# Patient Record
Sex: Male | Born: 1955 | Race: Black or African American | Hispanic: No | Marital: Married | Smoking: Former smoker
Health system: Southern US, Community
[De-identification: ages and names within clinical notes are randomized; demographics above are authoritative.]

## PROBLEM LIST (undated history)

## (undated) DIAGNOSIS — K118 Other diseases of salivary glands: Secondary | ICD-10-CM

## (undated) DIAGNOSIS — I10 Essential (primary) hypertension: Secondary | ICD-10-CM

## (undated) HISTORY — PX: PAROTID GLAND TUMOR EXCISION: SHX5221

## (undated) HISTORY — DX: Essential (primary) hypertension: I10

---

## 2004-08-01 HISTORY — PX: TONSILLECTOMY: SUR1361

## 2013-07-25 ENCOUNTER — Emergency Department: Payer: Self-pay | Admitting: Emergency Medicine

## 2015-01-22 ENCOUNTER — Other Ambulatory Visit: Payer: Self-pay

## 2015-01-29 ENCOUNTER — Ambulatory Visit: Payer: Self-pay

## 2015-02-03 ENCOUNTER — Ambulatory Visit: Payer: Self-pay

## 2015-02-04 ENCOUNTER — Ambulatory Visit: Payer: Self-pay | Admitting: Internal Medicine

## 2015-03-12 ENCOUNTER — Ambulatory Visit: Payer: Self-pay

## 2015-06-17 ENCOUNTER — Other Ambulatory Visit: Payer: Self-pay

## 2015-06-23 ENCOUNTER — Other Ambulatory Visit: Payer: Self-pay

## 2015-07-15 ENCOUNTER — Ambulatory Visit: Payer: Self-pay | Admitting: Internal Medicine

## 2015-07-15 DIAGNOSIS — I10 Essential (primary) hypertension: Secondary | ICD-10-CM | POA: Insufficient documentation

## 2015-08-05 ENCOUNTER — Ambulatory Visit: Payer: Self-pay

## 2015-08-05 LAB — LIPID PANEL
CHOLESTEROL: 159 mg/dL (ref 0–200)
HDL: 37 mg/dL (ref 35–70)
LDL CALC: 88 mg/dL
TRIGLYCERIDES: 169 mg/dL — AB (ref 40–160)

## 2015-08-05 LAB — BASIC METABOLIC PANEL
BUN: 12 mg/dL (ref 4–21)
Creatinine: 0.9 mg/dL (ref 0.6–1.3)
Glucose: 95 mg/dL
Potassium: 3.8 mmol/L (ref 3.4–5.3)
SODIUM: 143 mmol/L (ref 137–147)

## 2015-08-05 LAB — TSH: TSH: 0.31 u[IU]/mL — AB (ref 0.41–5.90)

## 2015-08-05 LAB — CBC AND DIFFERENTIAL
HEMATOCRIT: 46 % (ref 41–53)
HEMOGLOBIN: 16 g/dL (ref 13.5–17.5)
NEUTROS ABS: 3 /uL
Platelets: 227 10*3/uL (ref 150–399)
WBC: 6.6 10^3/mL

## 2015-08-05 LAB — HEPATIC FUNCTION PANEL
ALT: 19 U/L (ref 10–40)
AST: 29 U/L (ref 14–40)
Alkaline Phosphatase: 71 U/L (ref 25–125)
BILIRUBIN, TOTAL: 0.8 mg/dL

## 2015-09-18 DIAGNOSIS — I10 Essential (primary) hypertension: Secondary | ICD-10-CM

## 2015-10-13 ENCOUNTER — Other Ambulatory Visit: Payer: Self-pay

## 2015-10-14 ENCOUNTER — Telehealth: Payer: Self-pay

## 2015-10-14 ENCOUNTER — Other Ambulatory Visit: Payer: Self-pay

## 2015-10-14 DIAGNOSIS — E7800 Pure hypercholesterolemia, unspecified: Secondary | ICD-10-CM

## 2015-10-14 DIAGNOSIS — I10 Essential (primary) hypertension: Secondary | ICD-10-CM

## 2015-10-14 NOTE — Telephone Encounter (Signed)
Received fax from [facility] to refill lisinopril 20 mg tried to reorder received the following message The order you are attempting to reorder has a previously pended reorder. Do you still want to reorder?      lisinopril (PRINIVIL,ZESTRIL) 40 MG tablet (Order [id number]) was reordered and pended by Lelon MastLorrie Holt     Active Order  Pended Reorder    lisinopril (PRINIVIL,ZESTRIL) 40 MG tablet (Order [id number]) lisinopril (PRINIVIL,ZESTRIL) 40 MG tablet (Order [id number])   Start: (none) Start: 10/13/2015   End: (none) End: (none)   Sig: Take 40 mg by mouth daily. Sig: Take 1 tablet (40 mg total) by mouth daily.   Route: Oral Route: Oral   Class: Historical Med Class:    Provider Info     Ordering User: Darnell Levellivia Murray Ordering User: Lelon MastLorrie Holt   Authorizing Provider: Historical Provider, MD Authorizing Provider:

## 2015-10-15 LAB — CBC WITH DIFFERENTIAL/PLATELET
BASOS: 0 %
Basophils Absolute: 0 10*3/uL (ref 0.0–0.2)
EOS (ABSOLUTE): 0.1 10*3/uL (ref 0.0–0.4)
EOS: 2 %
HEMATOCRIT: 45.3 % (ref 37.5–51.0)
HEMOGLOBIN: 16.1 g/dL (ref 12.6–17.7)
IMMATURE GRANS (ABS): 0 10*3/uL (ref 0.0–0.1)
IMMATURE GRANULOCYTES: 0 %
LYMPHS: 44 %
Lymphocytes Absolute: 3.1 10*3/uL (ref 0.7–3.1)
MCH: 31.2 pg (ref 26.6–33.0)
MCHC: 35.5 g/dL (ref 31.5–35.7)
MCV: 88 fL (ref 79–97)
Monocytes Absolute: 0.7 10*3/uL (ref 0.1–0.9)
Monocytes: 10 %
NEUTROS ABS: 3.1 10*3/uL (ref 1.4–7.0)
NEUTROS PCT: 44 %
Platelets: 243 10*3/uL (ref 150–379)
RBC: 5.16 x10E6/uL (ref 4.14–5.80)
RDW: 14.2 % (ref 12.3–15.4)
WBC: 7.1 10*3/uL (ref 3.4–10.8)

## 2015-10-15 LAB — LIPID PANEL
CHOLESTEROL TOTAL: 172 mg/dL (ref 100–199)
Chol/HDL Ratio: 4.9 ratio units (ref 0.0–5.0)
HDL: 35 mg/dL — ABNORMAL LOW (ref >39)
LDL Calculated: 97 mg/dL (ref 0–99)
TRIGLYCERIDES: 201 mg/dL — AB (ref 0–149)
VLDL Cholesterol Cal: 40 mg/dL (ref 5–40)

## 2015-10-15 LAB — URINALYSIS
BILIRUBIN UA: NEGATIVE
GLUCOSE, UA: NEGATIVE
KETONES UA: NEGATIVE
Leukocytes, UA: NEGATIVE
Nitrite, UA: NEGATIVE
Protein, UA: NEGATIVE
RBC UA: NEGATIVE
Specific Gravity, UA: 1.013 (ref 1.005–1.030)
UUROB: 1 mg/dL (ref 0.2–1.0)
pH, UA: 7.5 (ref 5.0–7.5)

## 2015-10-15 LAB — COMPREHENSIVE METABOLIC PANEL
A/G RATIO: 1.1 — AB (ref 1.2–2.2)
ALBUMIN: 4 g/dL (ref 3.5–5.5)
ALT: 25 IU/L (ref 0–44)
AST: 32 IU/L (ref 0–40)
Alkaline Phosphatase: 69 IU/L (ref 39–117)
BUN/Creatinine Ratio: 10 (ref 9–20)
BUN: 8 mg/dL (ref 6–24)
Bilirubin Total: 0.7 mg/dL (ref 0.0–1.2)
CALCIUM: 9.4 mg/dL (ref 8.7–10.2)
CHLORIDE: 98 mmol/L (ref 96–106)
CO2: 27 mmol/L (ref 18–29)
Creatinine, Ser: 0.83 mg/dL (ref 0.76–1.27)
GFR calc Af Amer: 111 mL/min/{1.73_m2} (ref >59)
GFR calc non Af Amer: 96 mL/min/{1.73_m2} (ref >59)
GLOBULIN, TOTAL: 3.7 g/dL (ref 1.5–4.5)
Glucose: 99 mg/dL (ref 65–99)
Potassium: 3.7 mmol/L (ref 3.5–5.2)
Sodium: 140 mmol/L (ref 134–144)
TOTAL PROTEIN: 7.7 g/dL (ref 6.0–8.5)

## 2015-10-15 LAB — TSH: TSH: 0.571 u[IU]/mL (ref 0.450–4.500)

## 2015-10-20 ENCOUNTER — Telehealth: Payer: Self-pay

## 2015-10-20 NOTE — Telephone Encounter (Signed)
I called patient to reschedule his appointment on 3/22 patient is out of his Lisinopril 20 mg daily and needs a refill. His appointment is now 4/5.

## 2015-10-21 ENCOUNTER — Ambulatory Visit: Payer: Self-pay | Admitting: Internal Medicine

## 2015-10-21 ENCOUNTER — Other Ambulatory Visit: Payer: Self-pay

## 2015-10-21 DIAGNOSIS — I10 Essential (primary) hypertension: Secondary | ICD-10-CM

## 2015-10-21 MED ORDER — LISINOPRIL 40 MG PO TABS: 40.0000 mg | ORAL_TABLET | Freq: Every day | ORAL | Status: DC

## 2015-11-04 ENCOUNTER — Ambulatory Visit: Payer: Self-pay | Admitting: Internal Medicine

## 2015-11-04 ENCOUNTER — Encounter: Payer: Self-pay | Admitting: Internal Medicine

## 2015-11-04 VITALS — BP 145/93 | HR 84 | Wt 230.0 lb

## 2015-11-04 NOTE — Progress Notes (Signed)
   Subjective:    Patient ID: Allen Shah, male    DOB: [birth year], 60 y.o.   MRN: [id number]  HPI  Patient is here today for follow up for hypertension BP has improved but not at target  Patient Active Problem List   Diagnosis Date Noted  . Essential hypertension 07/15/2015   Review of Systems     Objective:   Physical Exam    BP 145/93 mmHg  Pulse 84  Wt 230 lb (104.327 kg)  SpO2 95%    Medication List       This list is accurate as of: 11/04/15  8:55 AM.  Always use your most recent med list.               amLODipine 10 MG tablet  Commonly known as:  NORVASC  Take 20 mg by mouth daily.     hydrochlorothiazide 25 MG tablet  Commonly known as:  HYDRODIURIL  Take 25 mg by mouth daily.     ibuprofen 800 MG tablet  Commonly known as:  ADVIL,MOTRIN  Take 800 mg by mouth as needed.     lisinopril 40 MG tablet  Commonly known as:  PRINIVIL,ZESTRIL  Take 1 tablet (40 mg total) by mouth daily.          Assessment & Plan:  Patient needs to return in 90 days. pt left before seeing provider had a dental appointment in [facility]

## 2015-11-11 ENCOUNTER — Ambulatory Visit: Payer: Self-pay | Admitting: Internal Medicine

## 2015-11-11 ENCOUNTER — Encounter: Payer: Self-pay | Admitting: Internal Medicine

## 2015-11-11 VITALS — BP 148/99 | HR 83 | Temp 98.4°F | Resp 17 | Wt 224.0 lb

## 2015-11-11 DIAGNOSIS — I499 Cardiac arrhythmia, unspecified: Secondary | ICD-10-CM | POA: Insufficient documentation

## 2015-11-11 DIAGNOSIS — I4891 Unspecified atrial fibrillation: Secondary | ICD-10-CM

## 2015-11-11 DIAGNOSIS — I10 Essential (primary) hypertension: Secondary | ICD-10-CM

## 2015-11-11 MED ORDER — IBUPROFEN 800 MG PO TABS: 800.0000 mg | ORAL_TABLET | ORAL | Status: AC | PRN

## 2015-11-11 MED ORDER — HYDROCHLOROTHIAZIDE 25 MG PO TABS: 25.0000 mg | ORAL_TABLET | Freq: Every day | ORAL | Status: DC

## 2015-11-11 MED ORDER — AMLODIPINE BESYLATE 10 MG PO TABS: 20.0000 mg | ORAL_TABLET | Freq: Every day | ORAL | Status: DC

## 2015-11-11 NOTE — Assessment & Plan Note (Addendum)
Pt takes the Lisinpril regularly but forgets to take the Amlodipine regularly. BP taken again while in the room with Dr. Candelaria Stagershaplin - still elevated. Ectopy in heart rhythm, unsure if related to high BP.

## 2015-11-11 NOTE — Progress Notes (Signed)
   Subjective:    Patient ID: Allen Shah, male    DOB: 02/13/1956, 60 y.o.   MRN: [id number]  HPI  Patient Active Problem List   Diagnosis Date Noted  . Cardiac arrhythmia 11/11/2015  . Essential hypertension 07/15/2015    Pt presents with a f/u for hypertension. Pt took Lisinopril today but has forgotten to take Amlodipine. BP elevated today.    Review of Systems  HENT: Positive for sore throat.   Respiratory: Positive for cough.        Objective:   Physical Exam  Constitutional: He is oriented to person, place, and time.  Cardiovascular:  Ectopy of cardiac rhythm.   Pulmonary/Chest: Effort normal and breath sounds normal.  Neurological: He is alert and oriented to person, place, and time.      Medication List       This list is accurate as of: 11/11/15  9:47 AM.  Always use your most recent med list.               amLODipine 10 MG tablet  Commonly known as:  NORVASC  Take 20 mg by mouth daily.     hydrochlorothiazide 25 MG tablet  Commonly known as:  HYDRODIURIL  Take 25 mg by mouth daily.     ibuprofen 800 MG tablet  Commonly known as:  ADVIL,MOTRIN  Take 800 mg by mouth as needed.     lisinopril 40 MG tablet  Commonly known as:  PRINIVIL,ZESTRIL  Take 1 tablet (40 mg total) by mouth daily.       BP 148/99 mmHg  Pulse 83  Temp(Src) 98.4 F (36.9 C)  Resp 17  Wt 224 lb (101.606 kg)  SpO2 98%     Assessment & Plan:  Labs look good.  Pt's cough is most likely due to allergy season.   Essential hypertension Pt takes the Lisinpril regularly but forgets to take the Amlodipine regularly. BP taken again while in the room with Dr. Candelaria Stagershaplin - still elevated. Ectopy in heart rhythm, unsure if related to high BP.   Cardiac arrhythmia Refer for EKG   Md f/u: 2 weeks to get BP checked EKG for evaluation of cardiac arhythmia

## 2015-11-11 NOTE — Assessment & Plan Note (Signed)
Refer for EKG

## 2015-11-25 ENCOUNTER — Encounter: Payer: Self-pay | Admitting: Internal Medicine

## 2015-11-25 ENCOUNTER — Ambulatory Visit: Payer: Self-pay | Admitting: Internal Medicine

## 2015-11-25 VITALS — BP 136/86 | HR 89 | Temp 98.1°F | Wt 225.0 lb

## 2015-11-25 DIAGNOSIS — I10 Essential (primary) hypertension: Secondary | ICD-10-CM

## 2015-11-25 MED ORDER — LISINOPRIL 40 MG PO TABS: 20.0000 mg | ORAL_TABLET | Freq: Every day | ORAL | Status: DC

## 2015-11-25 MED ORDER — AMLODIPINE BESYLATE 10 MG PO TABS: 10.0000 mg | ORAL_TABLET | Freq: Every day | ORAL | Status: DC

## 2015-11-25 NOTE — Progress Notes (Signed)
   Subjective:    Patient ID: Allen Shah, male    DOB: 11/06/1955, 60 y.o.   MRN: [id number]  HPI  Patient Active Problem List   Diagnosis Date Noted  . Cardiac arrhythmia 11/11/2015  . Essential hypertension 07/15/2015   Pt presents with a f/u for hypertension. Pt BP has dropped since last time. He has been taking one Amlodipine pill with Lisinopril daily. Does not need refills.   Pt has not gotten the EKG yet.   Pt takes Lisinopril 20 mg and 1 tablet of Amlodipine.   Review of Systems     Objective:   Physical Exam  Constitutional: He is oriented to person, place, and time.  Cardiovascular: Normal rate, regular rhythm and normal heart sounds.   Pulmonary/Chest: Effort normal and breath sounds normal.  Neurological: He is alert and oriented to person, place, and time.      Medication List       This list is accurate as of: 11/25/15 11:00 AM.  Always use your most recent med list.               amLODipine 10 MG tablet  Commonly known as:  NORVASC  Take 1 tablet (10 mg total) by mouth daily.     hydrochlorothiazide 25 MG tablet  Commonly known as:  HYDRODIURIL  Take 1 tablet (25 mg total) by mouth daily.     ibuprofen 800 MG tablet  Commonly known as:  ADVIL,MOTRIN  Take 1 tablet (800 mg total) by mouth as needed.     lisinopril 40 MG tablet  Commonly known as:  PRINIVIL,ZESTRIL  Take 0.5 tablets (20 mg total) by mouth daily.        BP 136/86 mmHg  Pulse 89  Temp(Src) 98.1 F (36.7 C)  Wt 225 lb (102.059 kg)      Assessment & Plan:  Md f/u in 2 months Labs: Met c, CBC  Essential hypertension Improved from last time.    Pt advised to get EKG at [facility].

## 2015-11-25 NOTE — Assessment & Plan Note (Signed)
Improved from last time

## 2015-12-15 ENCOUNTER — Other Ambulatory Visit: Payer: Self-pay

## 2015-12-15 DIAGNOSIS — I10 Essential (primary) hypertension: Secondary | ICD-10-CM

## 2015-12-17 MED ORDER — LISINOPRIL 40 MG PO TABS: 20.0000 mg | ORAL_TABLET | Freq: Every day | ORAL | Status: DC

## 2016-01-20 ENCOUNTER — Other Ambulatory Visit: Payer: Self-pay

## 2016-01-20 DIAGNOSIS — I10 Essential (primary) hypertension: Secondary | ICD-10-CM

## 2016-01-21 LAB — CBC WITH DIFFERENTIAL/PLATELET
Basophils Absolute: 0 10*3/uL (ref 0.0–0.2)
Basos: 0 %
EOS (ABSOLUTE): 0.1 10*3/uL (ref 0.0–0.4)
Eos: 2 %
Hematocrit: 42.2 % (ref 37.5–51.0)
Hemoglobin: 15 g/dL (ref 12.6–17.7)
Immature Grans (Abs): 0 10*3/uL (ref 0.0–0.1)
Immature Granulocytes: 0 %
Lymphocytes Absolute: 3.4 10*3/uL — ABNORMAL HIGH (ref 0.7–3.1)
Lymphs: 45 %
MCH: 31.3 pg (ref 26.6–33.0)
MCHC: 35.5 g/dL (ref 31.5–35.7)
MCV: 88 fL (ref 79–97)
Monocytes Absolute: 0.8 10*3/uL (ref 0.1–0.9)
Monocytes: 10 %
Neutrophils Absolute: 3.3 10*3/uL (ref 1.4–7.0)
Neutrophils: 43 %
Platelets: 240 10*3/uL (ref 150–379)
RBC: 4.79 x10E6/uL (ref 4.14–5.80)
RDW: 13.7 % (ref 12.3–15.4)
WBC: 7.7 10*3/uL (ref 3.4–10.8)

## 2016-01-21 LAB — COMPREHENSIVE METABOLIC PANEL
ALT: 29 IU/L (ref 0–44)
AST: 33 IU/L (ref 0–40)
Albumin/Globulin Ratio: 1.1 — ABNORMAL LOW (ref 1.2–2.2)
Albumin: 3.9 g/dL (ref 3.5–5.5)
Alkaline Phosphatase: 66 IU/L (ref 39–117)
BUN/Creatinine Ratio: 11 (ref 9–20)
BUN: 9 mg/dL (ref 6–24)
Bilirubin Total: 0.9 mg/dL (ref 0.0–1.2)
CO2: 24 mmol/L (ref 18–29)
Calcium: 9 mg/dL (ref 8.7–10.2)
Chloride: 100 mmol/L (ref 96–106)
Creatinine, Ser: 0.85 mg/dL (ref 0.76–1.27)
GFR calc Af Amer: 110 mL/min/{1.73_m2} (ref >59)
GFR calc non Af Amer: 95 mL/min/{1.73_m2} (ref >59)
Globulin, Total: 3.4 g/dL (ref 1.5–4.5)
Glucose: 87 mg/dL (ref 65–99)
Potassium: 3.6 mmol/L (ref 3.5–5.2)
Sodium: 141 mmol/L (ref 134–144)
Total Protein: 7.3 g/dL (ref 6.0–8.5)

## 2016-01-27 ENCOUNTER — Ambulatory Visit: Payer: Self-pay | Admitting: Internal Medicine

## 2016-01-27 ENCOUNTER — Encounter: Payer: Self-pay | Admitting: Internal Medicine

## 2016-01-27 VITALS — BP 143/86 | HR 78 | Temp 98.1°F

## 2016-01-27 DIAGNOSIS — I10 Essential (primary) hypertension: Secondary | ICD-10-CM

## 2016-01-27 NOTE — Progress Notes (Signed)
   Subjective:    Patient ID: Allen Shah, male    DOB: 04-11-1956, 60 y.o.   MRN: [id number]  HPI Patient presents today with hypertension. Patient also has pain in back.   Patient Active Problem List   Diagnosis Date Noted  . Cardiac arrhythmia 11/11/2015  . Essential hypertension 07/15/2015       Medication List       This list is accurate as of: 01/27/16  9:55 AM.  Always use your most recent med list.               amLODipine 10 MG tablet  Commonly known as:  NORVASC  Take 1 tablet (10 mg total) by mouth daily.     hydrochlorothiazide 25 MG tablet  Commonly known as:  HYDRODIURIL  Take 1 tablet (25 mg total) by mouth daily.     ibuprofen 800 MG tablet  Commonly known as:  ADVIL,MOTRIN  Take 1 tablet (800 mg total) by mouth as needed.     lisinopril 40 MG tablet  Commonly known as:  PRINIVIL,ZESTRIL  Take 0.5 tablets (20 mg total) by mouth daily.        Review of Systems     Objective:   Physical Exam  Constitutional: He is oriented to person, place, and time.  Cardiovascular: Normal rate and regular rhythm.   Pulmonary/Chest: Effort normal and breath sounds normal.  Neurological: He is alert and oriented to person, place, and time.  BP 143/86 mmHg  Pulse 78  Temp(Src) 98.1 F (36.7 C)         Assessment & Plan:  Follow up in 3 months with labs: CBC, MET B, PSA

## 2016-01-27 NOTE — Patient Instructions (Signed)
Follow up in 3 months with lab: MET B, CBC, PSA

## 2016-02-12 ENCOUNTER — Emergency Department: Payer: Self-pay

## 2016-02-12 ENCOUNTER — Emergency Department
Admission: EM | Admit: 2016-02-12 | Discharge: 2016-02-12 | Disposition: A | Discharge status: Home / Self Care | Payer: Self-pay | Attending: Emergency Medicine | Admitting: Emergency Medicine

## 2016-02-12 ENCOUNTER — Encounter: Payer: Self-pay | Admitting: *Deleted

## 2016-02-12 DIAGNOSIS — Z791 Long term (current) use of non-steroidal anti-inflammatories (NSAID): Secondary | ICD-10-CM | POA: Insufficient documentation

## 2016-02-12 DIAGNOSIS — Z87891 Personal history of nicotine dependence: Secondary | ICD-10-CM | POA: Insufficient documentation

## 2016-02-12 DIAGNOSIS — R0789 Other chest pain: Secondary | ICD-10-CM

## 2016-02-12 DIAGNOSIS — Z79899 Other long term (current) drug therapy: Secondary | ICD-10-CM | POA: Insufficient documentation

## 2016-02-12 DIAGNOSIS — I10 Essential (primary) hypertension: Secondary | ICD-10-CM | POA: Insufficient documentation

## 2016-02-12 DIAGNOSIS — E860 Dehydration: Secondary | ICD-10-CM | POA: Insufficient documentation

## 2016-02-12 HISTORY — DX: Other diseases of salivary glands: K11.8

## 2016-02-12 LAB — BASIC METABOLIC PANEL
Anion gap: 8 (ref 5–15)
BUN: 14 mg/dL (ref 6–20)
CALCIUM: 9.6 mg/dL (ref 8.9–10.3)
CO2: 24 mmol/L (ref 22–32)
CREATININE: 1.05 mg/dL (ref 0.61–1.24)
Chloride: 108 mmol/L (ref 101–111)
GFR calc Af Amer: >60 mL/min (ref >60)
Glucose, Bld: 109 mg/dL — ABNORMAL HIGH (ref 65–99)
POTASSIUM: 3.2 mmol/L — AB (ref 3.5–5.1)
SODIUM: 140 mmol/L (ref 135–145)

## 2016-02-12 LAB — CBC
HEMATOCRIT: 44.1 % (ref 40.0–52.0)
Hemoglobin: 15.6 g/dL (ref 13.0–18.0)
MCH: 31.4 pg (ref 26.0–34.0)
MCHC: 35.4 g/dL (ref 32.0–36.0)
MCV: 88.7 fL (ref 80.0–100.0)
PLATELETS: 254 10*3/uL (ref 150–440)
RBC: 4.97 MIL/uL (ref 4.40–5.90)
RDW: 13.3 % (ref 11.5–14.5)
WBC: 13.7 10*3/uL — AB (ref 3.8–10.6)

## 2016-02-12 LAB — TROPONIN I

## 2016-02-12 MED ORDER — ASPIRIN 81 MG PO CHEW
CHEWABLE_TABLET | ORAL | Status: AC
  Filled 2016-02-12: qty 4

## 2016-02-12 MED ORDER — ASPIRIN 81 MG PO CHEW
324.0000 mg | CHEWABLE_TABLET | Freq: Once | ORAL | Status: AC
  Administered 2016-02-12: 324 mg via ORAL

## 2016-02-12 MED ORDER — MORPHINE SULFATE (PF) 4 MG/ML IV SOLN
4.0000 mg | Freq: Once | INTRAVENOUS | Status: AC
  Administered 2016-02-12: 4 mg via INTRAVENOUS

## 2016-02-12 MED ORDER — OXYCODONE-ACETAMINOPHEN 5-325 MG PO TABS: 1.0000 | ORAL_TABLET | Freq: Four times a day (QID) | ORAL | Status: AC | PRN

## 2016-02-12 MED ORDER — ONDANSETRON HCL 4 MG/2ML IJ SOLN
4.0000 mg | Freq: Once | INTRAMUSCULAR | Status: AC
  Administered 2016-02-12: 4 mg via INTRAVENOUS

## 2016-02-12 MED ORDER — OXYCODONE-ACETAMINOPHEN 5-325 MG PO TABS
1.0000 | ORAL_TABLET | Freq: Once | ORAL | Status: AC
  Administered 2016-02-12: 1 via ORAL
  Filled 2016-02-12: qty 1

## 2016-02-12 MED ORDER — MORPHINE SULFATE (PF) 4 MG/ML IV SOLN
INTRAVENOUS | Status: AC
  Filled 2016-02-12: qty 1

## 2016-02-12 MED ORDER — ONDANSETRON HCL 4 MG/2ML IJ SOLN
INTRAMUSCULAR | Status: AC
  Filled 2016-02-12: qty 2

## 2016-02-12 MED ORDER — KETOROLAC TROMETHAMINE 30 MG/ML IJ SOLN
15.0000 mg | INTRAMUSCULAR | Status: AC
  Administered 2016-02-12: 15 mg via INTRAVENOUS
  Filled 2016-02-12: qty 1

## 2016-02-12 MED ORDER — SODIUM CHLORIDE 0.9 % IV BOLUS (SEPSIS)
1000.0000 mL | Freq: Once | INTRAVENOUS | Status: AC
  Administered 2016-02-12: 1000 mL via INTRAVENOUS

## 2016-02-12 NOTE — Discharge Instructions (Signed)
You were prescribed a medication that is potentially sedating. Do not drink alcohol, drive or participate in any other potentially dangerous activities while taking this medication as it may make you sleepy. Do not take this medication with any other sedating medications, either prescription or over-the-counter. If you were prescribed Percocet or Vicodin, do not take these with acetaminophen (Tylenol) as it is already contained within these medications.   Opioid pain medications (or "narcotics") can be habit forming.  Use it as little as possible to achieve adequate pain control.  Do not use or use it with extreme caution if you have a history of opiate abuse or dependence.  If you are on a pain contract with your primary care doctor or a pain specialist, be sure to let them know you were prescribed this medication today from the [facility].  This medication is intended for your use only - do not give any to anyone else and keep it in a secure place where nobody else, especially children and pets, have access to it.  It will also cause or worsen constipation, so you may want to consider taking an over-the-counter stool softener while you are taking this medication.  Chest Wall Pain Chest wall pain is pain in or around the bones and muscles of your chest. Sometimes, an injury causes this pain. Sometimes, the cause may not be known. This pain may take several weeks or longer to get better. HOME CARE INSTRUCTIONS  Pay attention to any changes in your symptoms. Take these actions to help with your pain:   Rest as told by your health care provider.   Avoid activities that cause pain. These include any activities that use your chest muscles or your abdominal and side muscles to lift heavy items.   If directed, apply ice to the painful area:  Put ice in a plastic bag.  Place a towel between your skin and the bag.  Leave the ice on for 20 minutes, 2-3 times per day.  Take  over-the-counter and prescription medicines only as told by your health care provider.  Do not use tobacco products, including cigarettes, chewing tobacco, and e-cigarettes. If you need help quitting, ask your health care provider.  Keep all follow-up visits as told by your health care provider. This is important. SEEK MEDICAL CARE IF:  You have a fever.  Your chest pain becomes worse.  You have new symptoms. SEEK IMMEDIATE MEDICAL CARE IF:  You have nausea or vomiting.  You feel sweaty or light-headed.  You have a cough with phlegm (sputum) or you cough up blood.  You develop shortness of breath.   This information is not intended to replace advice given to you by your health care provider. Make sure you discuss any questions you have with your health care provider.   Document Released: 07/18/2005 Document Revised: 04/08/2015 Document Reviewed: 10/13/2014 Elsevier Interactive Patient Education 2016 Elsevier Inc.  Dehydration, Adult Dehydration is a condition in which you do not have enough fluid or water in your body. It happens when you take in less fluid than you lose. Vital organs such as the kidneys, brain, and heart cannot function without a proper amount of fluids. Any loss of fluids from the body can cause dehydration.  Dehydration can range from mild to severe. This condition should be treated right away to help prevent it from becoming severe. CAUSES  This condition may be caused by:  Vomiting.  Diarrhea.  Excessive sweating, such as when exercising  in hot or humid weather.  Not drinking enough fluid during strenuous exercise or during an illness.  Excessive urine output.  Fever.  Certain medicines. RISK FACTORS This condition is more likely to develop in:  People who are taking certain medicines that cause the body to lose excess fluid (diuretics).   People who have a chronic illness, such as diabetes, that may increase urination.  Older adults.    People who live at high altitudes.   People who participate in endurance sports.  SYMPTOMS  Mild Dehydration  Thirst.  Dry lips.  Slightly dry mouth.  Dry, warm skin. Moderate Dehydration  Very dry mouth.   Muscle cramps.   Dark urine and decreased urine production.   Decreased tear production.   Headache.   Light-headedness, especially when you stand up from a sitting position.  Severe Dehydration  Changes in skin.   Cold and clammy skin.   Skin does not spring back quickly when lightly pinched and released.   Changes in body fluids.   Extreme thirst.   No tears.   Not able to sweat when body temperature is high, such as in hot weather.   Minimal urine production.   Changes in vital signs.   Rapid, weak pulse (more than 100 beats per minute when you are sitting still).   Rapid breathing.   Low blood pressure.   Other changes.   Sunken eyes.   Cold hands and feet.   Confusion.  Lethargy and difficulty being awakened.  Fainting (syncope).   Short-term weight loss.   Unconsciousness. DIAGNOSIS  This condition may be diagnosed based on your symptoms. You may also have tests to determine how severe your dehydration is. These tests may include:   Urine tests.   Blood tests.  TREATMENT  Treatment for this condition depends on the severity. Mild or moderate dehydration can often be treated at home. Treatment should be started right away. Do not wait until dehydration becomes severe. Severe dehydration needs to be treated at the hospital. Treatment for Mild Dehydration  Drinking plenty of water to replace the fluid you have lost.   Replacing minerals in your blood (electrolytes) that you may have lost.  Treatment for Moderate Dehydration  Consuming oral rehydration solution (ORS). Treatment for Severe Dehydration  Receiving fluid through an IV tube.   Receiving electrolyte solution through a feeding  tube that is passed through your nose and into your stomach (nasogastric tube or NG tube).  Correcting any abnormalities in electrolytes. HOME CARE INSTRUCTIONS   Drink enough fluid to keep your urine clear or pale yellow.   Drink water or fluid slowly by taking small sips. You can also try sucking on ice cubes.  Have food or beverages that contain electrolytes. Examples include bananas and sports drinks.  Take over-the-counter and prescription medicines only as told by your health care provider.   Prepare ORS according to the manufacturer's instructions. Take sips of ORS every 5 minutes until your urine returns to normal.  If you have vomiting or diarrhea, continue to try to drink water, ORS, or both.   If you have diarrhea, avoid:   Beverages that contain caffeine.   Fruit juice.   Milk.   Carbonated soft drinks.  Do not take salt tablets. This can lead to the condition of having too much sodium in your body (hypernatremia).  SEEK MEDICAL CARE IF:  You cannot eat or drink without vomiting.  You have had moderate diarrhea during a period of more  than 24 hours.  You have a fever. SEEK IMMEDIATE MEDICAL CARE IF:   You have extreme thirst.  You have severe diarrhea.  You have not urinated in 6-8 hours, or you have urinated only a small amount of very dark urine.  You have shriveled skin.  You are dizzy, confused, or both.   This information is not intended to replace advice given to you by your health care provider. Make sure you discuss any questions you have with your health care provider.   Document Released: 07/18/2005 Document Revised: 04/08/2015 Document Reviewed: 12/03/2014 Elsevier Interactive Patient Education [employer]2016 Elsevier Inc.

## 2016-02-12 NOTE — ED Provider Notes (Signed)
[facility] Emergency Department Provider Note  ____________________________________________  Time seen: 10:25 PM  I have reviewed the triage vital signs and the nursing notes.   HISTORY  Chief Complaint Chest Pain    HPI Allen Shah is a 60 y.o. male who complains of sudden onset of right rib and chest pain while working in the yard today, around 6 PM today. This worse with movement. Not exertional. He had been outside mowing the grass and edging the driveway and using a shovel to clean up some edges of the yard. This is not normal activity for him. Developed this pain in the right chest also became dizzy. No vomiting. No shortness of breath. Had not had any water to drink for many hours when this happened.     Past Medical History  Diagnosis Date  . Hypertension   . Parotid mass      Patient Active Problem List   Diagnosis Date Noted  . Cardiac arrhythmia 11/11/2015  . Essential hypertension 07/15/2015     Past Surgical History  Procedure Laterality Date  . Tonsillectomy  2006  . Parotid gland tumor excision Left      Current Outpatient Rx  Name  Route  Sig  Dispense  Refill  . amLODipine (NORVASC) 10 MG tablet   Oral   Take 1 tablet (10 mg total) by mouth daily.   90 tablet   2   . hydrochlorothiazide (HYDRODIURIL) 25 MG tablet   Oral   Take 1 tablet (25 mg total) by mouth daily.   90 tablet   2   . ibuprofen (ADVIL,MOTRIN) 800 MG tablet   Oral   Take 1 tablet (800 mg total) by mouth as needed.   30 tablet   2   . lisinopril (PRINIVIL,ZESTRIL) 40 MG tablet   Oral   Take 0.5 tablets (20 mg total) by mouth daily.   90 tablet   2   . oxyCODONE-acetaminophen (ROXICET) 5-325 MG tablet   Oral   Take 1 tablet by mouth every 6 (six) hours as needed for severe pain.   6 tablet   0      Allergies Review of patient's allergies indicates no known allergies.   Family History  Problem Relation Age of Onset  . Cancer  Mother   . Diabetes type II Father   . Kidney disease Father   . Hypertension Father     Social History Social History  Substance Use Topics  . Smoking status: Former Smoker    Quit date: 09/17/2005  . Smokeless tobacco: Never Used  . Alcohol Use: No    Review of Systems  Constitutional:   No fever or chills.  ENT:   No sore throat. No rhinorrhea. Cardiovascular:   Positive as above chest pain. Respiratory:   No dyspnea or cough. Gastrointestinal:   Negative for abdominal pain, vomiting and diarrhea.  Genitourinary:   Negative for dysuria or difficulty urinating. Musculoskeletal:   Negative for focal pain or swelling Neurological:   Negative for headaches. Positive dizziness 10-point ROS otherwise negative.  ____________________________________________   PHYSICAL EXAM:  VITAL SIGNS: ED Triage Vitals  Enc Vitals Group     BP 02/12/16 2055 120/94 mmHg     Pulse Rate 02/12/16 2055 83     Resp 02/12/16 2055 20     Temp 02/12/16 2055 98 F (36.7 C)     Temp Source 02/12/16 2055 Oral     SpO2 02/12/16 2055 97 %  Weight 02/12/16 2055 225 lb (102.059 kg)     Height 02/12/16 2055 6' (1.829 m)     Head Cir --      Peak Flow --      Pain Score 02/12/16 2056 10     Pain Loc --      Pain Edu? --      Excl. in GC? --     Vital signs reviewed, nursing assessments reviewed.   Constitutional:   Alert and oriented. Well appearing and in no distress. Eyes:   No scleral icterus. No conjunctival pallor. PERRL. EOMI.  No nystagmus. ENT   Head:   Normocephalic and atraumatic.   Nose:   No congestion/rhinnorhea. No septal hematoma   Mouth/Throat:   MMM, no pharyngeal erythema. No peritonsillar mass.    Neck:   No stridor. No SubQ emphysema. No meningismus. Hematological/Lymphatic/Immunilogical:   No cervical lymphadenopathy. Cardiovascular:   RRR. Symmetric bilateral radial and DP pulses.  No murmurs.  Respiratory:   Normal respiratory effort without tachypnea  nor retractions. Breath sounds are clear and equal bilaterally. No wheezes/rales/rhonchi. Gastrointestinal:   Soft and nontender. Non distended. There is no CVA tenderness.  No rebound, rigidity, or guarding. Genitourinary:   deferred Musculoskeletal:   Nontender with normal range of motion in all extremities. No joint effusions.  No lower extremity tenderness.  No edema.Right inferior posterior chest wall tender to the touch, reproduces his pain Neurologic:   Normal speech and language.  CN 2-10 normal. Motor grossly intact. No gross focal neurologic deficits are appreciated.  Skin:    Skin is warm, dry and intact. No rash noted.  No petechiae, purpura, or bullae.  ____________________________________________    LABS (pertinent positives/negatives) (all labs ordered are listed, but only abnormal results are displayed) Labs Reviewed  BASIC METABOLIC PANEL - Abnormal; Notable for the following:    Potassium 3.2 (*)    Glucose, Bld 109 (*)    All other components within normal limits  CBC - Abnormal; Notable for the following:    WBC 13.7 (*)    All other components within normal limits  TROPONIN I   ____________________________________________   EKG  Interpreted by me Normal sinus rhythm rate of 81, normal axis and intervals. Normal QRS ST segments and T waves  ____________________________________________    RADIOLOGY  Chest x-ray unremarkable  ____________________________________________   PROCEDURES   ____________________________________________   INITIAL IMPRESSION / ASSESSMENT AND PLAN / ED COURSE  Pertinent labs & imaging results that were available during my care of the patient were reviewed by me and considered in my medical decision making (see chart for details).  Patient well appearing no acute distress. EKG unremarkable, labs unremarkable. Vital signs normal. Muscular skeletal chest wall pain with dehydration.Considering the patient's symptoms, medical  history, and physical examination today, I have low suspicion for ACS, PE, TAD, pneumothorax, carditis, mediastinitis, pneumonia, CHF, or sepsis.  Feels better with analgesia and IV fluids. I'll give oral Percocet to maintain symptom control through the night, very short prescription for Percocet. Toradol. Follow-up with primary care.     ____________________________________________   FINAL CLINICAL IMPRESSION(S) / ED DIAGNOSES  Final diagnoses:  Dehydration  Chest wall pain       Portions of this note were generated with dragon dictation software. Dictation errors may occur despite best attempts at proofreading.   Sharman CheekPhillip Keo Schirmer, MD 02/12/16 2257

## 2016-02-12 NOTE — ED Notes (Signed)
Pt presents w/ c/o sudden onset of R rib and chest pain while working in the yard today. Pt reports pain unresolved w/ position changes and OTC ibuprofen taken prior to arrival. Pt reports pain worse w/ movement.

## 2016-03-24 ENCOUNTER — Ambulatory Visit: Payer: Self-pay | Admitting: Ophthalmology

## 2016-04-13 ENCOUNTER — Ambulatory Visit: Payer: Self-pay | Admitting: Ophthalmology

## 2016-04-27 ENCOUNTER — Other Ambulatory Visit: Payer: Self-pay

## 2016-04-27 DIAGNOSIS — I10 Essential (primary) hypertension: Secondary | ICD-10-CM

## 2016-04-28 LAB — BASIC METABOLIC PANEL
BUN/Creatinine Ratio: 13 (ref 10–24)
BUN: 12 mg/dL (ref 8–27)
CALCIUM: 9.4 mg/dL (ref 8.6–10.2)
CO2: 25 mmol/L (ref 18–29)
CREATININE: 0.93 mg/dL (ref 0.76–1.27)
Chloride: 102 mmol/L (ref 96–106)
GFR calc Af Amer: 103 mL/min/{1.73_m2} (ref >59)
GFR, EST NON AFRICAN AMERICAN: 89 mL/min/{1.73_m2} (ref >59)
Glucose: 95 mg/dL (ref 65–99)
POTASSIUM: 3.3 mmol/L — AB (ref 3.5–5.2)
Sodium: 143 mmol/L (ref 134–144)

## 2016-04-28 LAB — CBC WITH DIFFERENTIAL/PLATELET
BASOS ABS: 0 10*3/uL (ref 0.0–0.2)
BASOS: 0 %
EOS (ABSOLUTE): 0.1 10*3/uL (ref 0.0–0.4)
Eos: 1 %
HEMATOCRIT: 41 % (ref 37.5–51.0)
HEMOGLOBIN: 14.8 g/dL (ref 12.6–17.7)
IMMATURE GRANULOCYTES: 0 %
Immature Grans (Abs): 0 10*3/uL (ref 0.0–0.1)
LYMPHS: 38 %
Lymphocytes Absolute: 2.7 10*3/uL (ref 0.7–3.1)
MCH: 31 pg (ref 26.6–33.0)
MCHC: 36.1 g/dL — ABNORMAL HIGH (ref 31.5–35.7)
MCV: 86 fL (ref 79–97)
MONOS ABS: 0.8 10*3/uL (ref 0.1–0.9)
Monocytes: 12 %
NEUTROS PCT: 49 %
Neutrophils Absolute: 3.4 10*3/uL (ref 1.4–7.0)
Platelets: 273 10*3/uL (ref 150–379)
RBC: 4.78 x10E6/uL (ref 4.14–5.80)
RDW: 14.1 % (ref 12.3–15.4)
WBC: 7.1 10*3/uL (ref 3.4–10.8)

## 2016-04-28 LAB — PSA: PROSTATE SPECIFIC AG, SERUM: 1.8 ng/mL (ref 0.0–4.0)

## 2016-05-04 ENCOUNTER — Ambulatory Visit: Payer: Self-pay | Admitting: Internal Medicine

## 2016-05-04 ENCOUNTER — Encounter: Payer: Self-pay | Admitting: Internal Medicine

## 2016-05-04 DIAGNOSIS — I10 Essential (primary) hypertension: Secondary | ICD-10-CM

## 2016-05-04 MED ORDER — TRIAMTERENE-HCTZ 75-50 MG PO TABS: 1.0000 | ORAL_TABLET | Freq: Every day | ORAL | 2 refills | Status: AC

## 2016-05-04 MED ORDER — LISINOPRIL 40 MG PO TABS: 20.0000 mg | ORAL_TABLET | Freq: Every day | ORAL | 2 refills | Status: AC

## 2016-05-04 MED ORDER — AMLODIPINE BESYLATE 10 MG PO TABS: 10.0000 mg | ORAL_TABLET | Freq: Every day | ORAL | 2 refills | Status: AC

## 2016-05-04 NOTE — Progress Notes (Signed)
   Subjective:    Patient ID: Allen Shah, male    DOB: 10/26/1955, 60 y.o.   MRN: [id number]  HPI   Pt f/u for lab results. Results look normal w/ elevated Potassium levels Pt reports visit to ED in July for dehydration  BP elevated at 146/94  Patient Active Problem List   Diagnosis Date Noted  . Cardiac arrhythmia 11/11/2015  . Essential hypertension 07/15/2015     Medication List       Accurate as of 05/04/16 10:43 AM. Always use your most recent med list.          amLODipine 10 MG tablet Commonly known as:  NORVASC Take 1 tablet (10 mg total) by mouth daily.   hydrochlorothiazide 25 MG tablet Commonly known as:  HYDRODIURIL Take 1 tablet (25 mg total) by mouth daily.   ibuprofen 800 MG tablet Commonly known as:  ADVIL,MOTRIN Take 1 tablet (800 mg total) by mouth as needed.   lisinopril 40 MG tablet Commonly known as:  PRINIVIL,ZESTRIL Take 0.5 tablets (20 mg total) by mouth daily.   oxyCODONE-acetaminophen 5-325 MG tablet Commonly known as:  ROXICET Take 1 tablet by mouth every 6 (six) hours as needed for severe pain.         Review of Systems  BP retaken at 146/94    Objective:   Physical Exam  Constitutional: He is oriented to person, place, and time.  Cardiovascular: Normal rate, regular rhythm and normal heart sounds.   Pulmonary/Chest: Effort normal and breath sounds normal.  Neurological: He is alert and oriented to person, place, and time.     BP (!) 150/100   Pulse 73   Temp 98.2 F (36.8 C) (Oral)   Wt 224 lb (101.6 kg)   BMI 30.38 kg/m        Assessment & Plan:   F/u in 3 mnths w/ labs: Met C, CBC

## 2016-05-04 NOTE — Patient Instructions (Signed)
F/u in 3 mnths w/ labs: Met C, CBC

## 2016-08-03 ENCOUNTER — Ambulatory Visit: Payer: Self-pay | Admitting: Internal Medicine

## 2016-12-26 ENCOUNTER — Encounter: Payer: Self-pay | Admitting: Emergency Medicine

## 2016-12-26 ENCOUNTER — Emergency Department: Payer: BLUE CROSS/BLUE SHIELD

## 2016-12-26 ENCOUNTER — Emergency Department
Admission: EM | Admit: 2016-12-26 | Discharge: 2016-12-26 | Disposition: A | Discharge status: Home / Self Care | Payer: BLUE CROSS/BLUE SHIELD | Attending: Emergency Medicine | Admitting: Emergency Medicine

## 2016-12-26 DIAGNOSIS — Z87891 Personal history of nicotine dependence: Secondary | ICD-10-CM | POA: Diagnosis not present

## 2016-12-26 DIAGNOSIS — R1011 Right upper quadrant pain: Secondary | ICD-10-CM | POA: Diagnosis not present

## 2016-12-26 DIAGNOSIS — I10 Essential (primary) hypertension: Secondary | ICD-10-CM | POA: Insufficient documentation

## 2016-12-26 DIAGNOSIS — J984 Other disorders of lung: Secondary | ICD-10-CM | POA: Diagnosis not present

## 2016-12-26 DIAGNOSIS — M549 Dorsalgia, unspecified: Secondary | ICD-10-CM

## 2016-12-26 DIAGNOSIS — R109 Unspecified abdominal pain: Secondary | ICD-10-CM

## 2016-12-26 DIAGNOSIS — R52 Pain, unspecified: Secondary | ICD-10-CM

## 2016-12-26 DIAGNOSIS — Z79899 Other long term (current) drug therapy: Secondary | ICD-10-CM | POA: Insufficient documentation

## 2016-12-26 LAB — DIFFERENTIAL
BASOS ABS: 0.1 10*3/uL (ref 0–0.1)
BASOS PCT: 1 %
EOS ABS: 0.2 10*3/uL (ref 0–0.7)
EOS PCT: 2 %
Lymphocytes Relative: 42 %
Lymphs Abs: 5.4 10*3/uL — ABNORMAL HIGH (ref 1.0–3.6)
Monocytes Absolute: 1.3 10*3/uL — ABNORMAL HIGH (ref 0.2–1.0)
Monocytes Relative: 10 %
Neutro Abs: 5.7 10*3/uL (ref 1.4–6.5)
Neutrophils Relative %: 45 %

## 2016-12-26 LAB — URINALYSIS, COMPLETE (UACMP) WITH MICROSCOPIC
BILIRUBIN URINE: NEGATIVE
Bacteria, UA: NONE SEEN
Glucose, UA: NEGATIVE mg/dL
HGB URINE DIPSTICK: NEGATIVE
KETONES UR: NEGATIVE mg/dL
LEUKOCYTES UA: NEGATIVE
Nitrite: NEGATIVE
PH: 5 (ref 5.0–8.0)
Protein, ur: NEGATIVE mg/dL
SQUAMOUS EPITHELIAL / LPF: NONE SEEN
Specific Gravity, Urine: 1.031 — ABNORMAL HIGH (ref 1.005–1.030)

## 2016-12-26 LAB — COMPREHENSIVE METABOLIC PANEL
ALT: 29 U/L (ref 17–63)
ANION GAP: 8 (ref 5–15)
AST: 38 U/L (ref 15–41)
Albumin: 4 g/dL (ref 3.5–5.0)
Alkaline Phosphatase: 55 U/L (ref 38–126)
BUN: 17 mg/dL (ref 6–20)
CHLORIDE: 107 mmol/L (ref 101–111)
CO2: 24 mmol/L (ref 22–32)
Calcium: 9.5 mg/dL (ref 8.9–10.3)
Creatinine, Ser: 1.35 mg/dL — ABNORMAL HIGH (ref 0.61–1.24)
GFR, EST NON AFRICAN AMERICAN: 56 mL/min — AB (ref >60)
Glucose, Bld: 105 mg/dL — ABNORMAL HIGH (ref 65–99)
Potassium: 3.7 mmol/L (ref 3.5–5.1)
SODIUM: 139 mmol/L (ref 135–145)
Total Bilirubin: 0.9 mg/dL (ref 0.3–1.2)
Total Protein: 8.4 g/dL — ABNORMAL HIGH (ref 6.5–8.1)

## 2016-12-26 LAB — CBC
HCT: 41.3 % (ref 40.0–52.0)
HEMOGLOBIN: 14.4 g/dL (ref 13.0–18.0)
MCH: 31.2 pg (ref 26.0–34.0)
MCHC: 35 g/dL (ref 32.0–36.0)
MCV: 89.1 fL (ref 80.0–100.0)
Platelets: 301 10*3/uL (ref 150–440)
RBC: 4.63 MIL/uL (ref 4.40–5.90)
RDW: 13.7 % (ref 11.5–14.5)
WBC: 12.7 10*3/uL — AB (ref 3.8–10.6)

## 2016-12-26 LAB — LIPASE, BLOOD: LIPASE: 37 U/L (ref 11–51)

## 2016-12-26 MED ORDER — SODIUM CHLORIDE 0.9 % IV SOLN
Freq: Once | INTRAVENOUS | Status: AC
  Administered 2016-12-26: 999 mL via INTRAVENOUS

## 2016-12-26 MED ORDER — HYDROMORPHONE HCL 1 MG/ML IJ SOLN
0.5000 mg | Freq: Once | INTRAMUSCULAR | Status: AC
  Administered 2016-12-26: 0.5 mg via INTRAVENOUS

## 2016-12-26 MED ORDER — DIAZEPAM 5 MG PO TABS: 5.0000 mg | ORAL_TABLET | Freq: Three times a day (TID) | ORAL | 0 refills | Status: AC | PRN

## 2016-12-26 MED ORDER — OXYCODONE-ACETAMINOPHEN 5-325 MG PO TABS
ORAL_TABLET | ORAL | Status: AC
  Filled 2016-12-26: qty 1

## 2016-12-26 MED ORDER — OXYCODONE-ACETAMINOPHEN 5-325 MG PO TABS
1.0000 | ORAL_TABLET | ORAL | Status: DC | PRN
  Administered 2016-12-26: 1 via ORAL

## 2016-12-26 MED ORDER — HYDROMORPHONE HCL 1 MG/ML IJ SOLN
INTRAMUSCULAR | Status: AC
  Administered 2016-12-26: 0.5 mg via INTRAVENOUS
  Filled 2016-12-26: qty 1

## 2016-12-26 MED ORDER — HYDROCODONE-ACETAMINOPHEN 5-325 MG PO TABS: 1.0000 | ORAL_TABLET | Freq: Four times a day (QID) | ORAL | 0 refills | Status: AC | PRN

## 2016-12-26 MED ORDER — IOPAMIDOL (ISOVUE-370) INJECTION 76%
75.0000 mL | Freq: Once | INTRAVENOUS | Status: AC | PRN
  Administered 2016-12-26: 75 mL via INTRAVENOUS

## 2016-12-26 MED ORDER — HYDROMORPHONE HCL 1 MG/ML IJ SOLN
0.5000 mg | Freq: Once | INTRAMUSCULAR | Status: AC
  Administered 2016-12-26: 0.5 mg via INTRAVENOUS
  Filled 2016-12-26: qty 1

## 2016-12-26 NOTE — Consult Note (Signed)
Patient ID: Allen Shah, male   DOB: October 26, 1955, 61 y.o.   MRN: [id number]  HPI Allen Shah is a 61 y.o. male asked to see in consultation by Dr. Juliette AlcideMelinda for questionable symptomatic cholelithiasis. He says the pain started early this morning around 2 AM awoke from sleep. Patient reports that started having severe back pain radiated to the posterior chest wall and into the right flank. Pain is constant worsening with certain positions and sharp in nature. He also reports some nausea and decreased appetite. No evidence of cholangitis, no fevers no chills no diarrhea. No evidence of biliary obstruction. Workup in the emergency room included a right upper quadrant ultrasound that I have personally reviewed showing evidence of gallstones without any pericholecystic fluid and with a normal gallbladder wall. Normal biliary anatomy. His white count is slightly elevated to 12,000 LFTs are normal. Of note he has had a history of some back issues in the past.  HPI  Past Medical History:  Diagnosis Date  . Hypertension   . Parotid mass     Past Surgical History:  Procedure Laterality Date  . PAROTID GLAND TUMOR EXCISION Left   . TONSILLECTOMY  2006    Family History  Problem Relation Age of Onset  . Cancer Mother   . Diabetes type II Father   . Kidney disease Father   . Hypertension Father     Social History Social History  Substance Use Topics  . Smoking status: Former Smoker    Quit date: 09/17/2005  . Smokeless tobacco: Never Used  . Alcohol use No    No Known Allergies  Current Facility-Administered Medications  Medication Dose Route Frequency Provider Last Rate Last Dose  . 0.9 %  sodium chloride infusion   Intravenous Once Arnaldo NatalMalinda, Paul F, MD      . oxyCODONE-acetaminophen (PERCOCET/ROXICET) 5-325 MG per tablet 1 tablet  1 tablet Oral Q30 min PRN Arnaldo NatalMalinda, Paul F, MD   1 tablet at 12/26/16 1005   Current Outpatient Prescriptions  Medication Sig Dispense Refill  . amLODipine  (NORVASC) 10 MG tablet Take 1 tablet (10 mg total) by mouth daily. 90 tablet 2  . fluticasone (FLONASE) 50 MCG/ACT nasal spray Place 1 spray into both nostrils daily.  10  . lisinopril (PRINIVIL,ZESTRIL) 40 MG tablet Take 0.5 tablets (20 mg total) by mouth daily. 90 tablet 2  . Olopatadine HCl 0.2 % SOLN Place 1 drop into both eyes daily.  10  . triamterene-hydrochlorothiazide (MAXZIDE) 75-50 MG tablet Take 1 tablet by mouth daily. 90 tablet 2  . ibuprofen (ADVIL,MOTRIN) 800 MG tablet Take 1 tablet (800 mg total) by mouth as needed. (Patient not taking: Reported on 12/26/2016) 30 tablet 2  . oxyCODONE-acetaminophen (ROXICET) 5-325 MG tablet Take 1 tablet by mouth every 6 (six) hours as needed for severe pain. (Patient not taking: Reported on 12/26/2016) 6 tablet 0     Review of Systems Full ROS  was asked and was negative except for the information on the HPI  Physical Exam Blood pressure 128/85, pulse 72, temperature 99 F (37.2 C), temperature source Oral, resp. rate 14, height 6' (1.829 m), weight 103 kg (227 lb), SpO2 93 %. CONSTITUTIONAL: NAD . EYES: Pupils are equal, round, and reactive to light, Sclera are non-icteric. EARS, NOSE, MOUTH AND THROAT: The oropharynx is clear. The oral mucosa is pink and moist. Hearing is intact to voice. LYMPH NODES:  Lymph nodes in the neck are normal. RESPIRATORY:  Lungs are clear. There is normal respiratory  effort, with equal breath sounds bilaterally, and without pathologic use of accessory muscles. CARDIOVASCULAR: Heart is regular without murmurs, gallops, or rubs. GI: The abdomen is soft, mild tenderness to palpation mainly on the right costal margin.  No peritonitis negative Murphy sign GU: Rectal deferred.   MUSCULOSKELETAL: Normal muscle strength and tone. No cyanosis or edema.   SKIN: Turgor is good and there are no pathologic skin lesions or ulcers. NEUROLOGIC: Motor and sensation is grossly normal. Cranial nerves are grossly intact. PSYCH:   Oriented to person, place and time. Affect is normal. BacK : Exquisite tenderness to palpation on the lower thoracic and upper lumbar spine as well as the posterior  Data Reviewed  I have personally reviewed the patient's imaging, laboratory findings and medical records.    Assessment/Plan Patient with back pain and exquisite right-sided chest wall and rib cage pain more consistent with musculoskeletal nature questionable intercostal neuralgia versus radiculopathy. His clinical symptoms are not consistent with biliary disease and certainly does not have any evidence of cholecystitis. I will be happy to follow him as an outpatient and contemplate an elective cholecystectomy once we determined in more detail the source of his current pain. As stated above I do not think that his current symptoms are attributable to his gallbladder. No surgical intervention at this time. Case discussed in detail with Dr. Juliette Alcide.  Sterling Big, MD FACS General Surgeon 12/26/2016, 1:13 PM

## 2016-12-26 NOTE — Discharge Instructions (Signed)
Please follow the instructions of Dr. Cherlynn KaiserSainani gave you. Take the medicines he prescribed for you have as directed. Please return here if you have worse pain fever or shortness of breath or any other complaints.

## 2016-12-26 NOTE — Consult Note (Signed)
[facility] - [place] [facility]   PATIENT NAME: Allen Shah    MR#:  [id number]  DATE OF BIRTH:  12/10/1955  DATE OF CONSULT:  12/26/2016  PRIMARY CARE PHYSICIAN: Quinn Plowman, PA-C   REQUESTING/REFERRING PHYSICIAN: Dr. Dorothea Glassman  CHIEF COMPLAINT:   Chief Complaint  Patient presents with  . Flank Pain    HISTORY OF PRESENT ILLNESS:  Allen Shah  is a 61 y.o. male with a known history of Essential hypertension who presents to the hospital complaining of right-sided flank pain radiating to the midline of his body that started earlier today. Patient describes the pain as aching in nature located in his back radiating to the front of his body worsening with some movement and associated with some nausea and dry heaving but no actual vomiting. He denies any fever or chills sick contact's, diarrhea, melena, hematochezia or any other associated symptoms. He presented to the emergency room was given multiple doses of IV Dilaudid without much improvement of his pain. He also underwent a CT scan of his chest and abdomen pelvis which was negative for any acute pathology. His RUQ US showed cholelithiasis but no evidence of acute cholecystitis. He has been seen by general surgery who do not think the patient needs any urgent surgical intervention. Hospitalist services were contacted for consultation/admission.   PAST MEDICAL HISTORY:   Past Medical History:  Diagnosis Date  . Hypertension   . Parotid mass     PAST SURGICAL HISTOIRY:   Past Surgical History:  Procedure Laterality Date  . PAROTID GLAND TUMOR EXCISION Left   . TONSILLECTOMY  2006    SOCIAL HISTORY:   Social History  Substance Use Topics  . Smoking status: Former Smoker    Packs/day: 0.50    Years: 15.00    Types: Cigarettes    Quit date: 09/17/2005  . Smokeless tobacco: Never Used  . Alcohol use No    FAMILY HISTORY:   Family History  Problem Relation Age of Onset  . Pancreatic  cancer Mother   . Diabetes type II Father   . Kidney disease Father   . Hypertension Father     DRUG ALLERGIES:  No Known Allergies  REVIEW OF SYSTEMS:   Review of Systems  Constitutional: Negative for fever and weight loss.  HENT: Negative for congestion, nosebleeds and tinnitus.   Eyes: Negative for blurred vision, double vision and redness.  Respiratory: Negative for cough, hemoptysis and shortness of breath.   Cardiovascular: Negative for chest pain, orthopnea, leg swelling and PND.  Gastrointestinal: Positive for abdominal pain (Right Flank abdominal pain). Negative for diarrhea, melena, nausea and vomiting.  Genitourinary: Negative for dysuria, hematuria and urgency.  Musculoskeletal: Positive for back pain. Negative for falls and joint pain.  Neurological: Negative for dizziness, tingling, sensory change, focal weakness, seizures, weakness and headaches.  Endo/Heme/Allergies: Negative for polydipsia. Does not bruise/bleed easily.  Psychiatric/Behavioral: Negative for depression and memory loss. The patient is not nervous/anxious.      MEDICATIONS AT HOME:   Prior to Admission medications   Medication Sig Start Date End Date Taking? Authorizing Provider  amLODipine (NORVASC) 10 MG tablet Take 1 tablet (10 mg total) by mouth daily. 05/04/16  Yes Virl Axe, MD  fluticasone (FLONASE) 50 MCG/ACT nasal spray Place 1 spray into both nostrils daily. 12/08/16  Yes [provider]  lisinopril (PRINIVIL,ZESTRIL) 40 MG tablet Take 0.5 tablets (20 mg total) by mouth daily. 05/04/16  Yes Virl Axe, MD  Olopatadine HCl 0.2 % SOLN Place 1 drop into both eyes daily. 12/09/16  Yes [provider]  triamterene-hydrochlorothiazide (MAXZIDE) 75-50 MG tablet Take 1 tablet by mouth daily. 05/04/16  Yes Virl Axehaplin, Don C, MD  diazepam (VALIUM) 5 MG tablet Take 1 tablet (5 mg total) by mouth every 8 (eight) hours as needed. 12/26/16 12/26/17  Houston SirenSainani, Brailyn Killion J, MD   HYDROcodone-acetaminophen (NORCO) 5-325 MG tablet Take 1-2 tablets by mouth every 6 (six) hours as needed for moderate pain. 12/26/16   Houston SirenSainani, Isaih Bulger J, MD  ibuprofen (ADVIL,MOTRIN) 800 MG tablet Take 1 tablet (800 mg total) by mouth as needed. Patient not taking: Reported on 12/26/2016 11/11/15   Virl Axehaplin, Don C, MD  oxyCODONE-acetaminophen (ROXICET) 5-325 MG tablet Take 1 tablet by mouth every 6 (six) hours as needed for severe pain. Patient not taking: Reported on 12/26/2016 02/12/16   Sharman CheekStafford, Phillip, MD      VITAL SIGNS:  Blood pressure 114/61, pulse 71, temperature 99 F (37.2 C), temperature source Oral, resp. rate 13, height 6' (1.829 m), weight 103 kg (227 lb), SpO2 97 %.  PHYSICAL EXAMINATION:  GENERAL:  61 y.o.-year-old obese patient lying in bed in no acute distress.  EYES: Pupils equal, round, reactive to light and accommodation. No scleral icterus. Extraocular muscles intact.  HEENT: Head atraumatic, normocephalic. Oropharynx and nasopharynx clear.  NECK:  Supple, no jugular venous distention. No thyroid enlargement, no tenderness.  LUNGS: Normal breath sounds bilaterally, no wheezing, rales, rhonchi . No use of accessory muscles of respiration.  CARDIOVASCULAR: S1, S2, RRR. No murmurs, rubs, gallops, clicks.  ABDOMEN: Soft, nontender, nondistended. Bowel sounds present. No organomegaly or mass.  EXTREMITIES: No pedal edema, cyanosis, or clubbing.  NEUROLOGIC: Cranial nerves II through XII are intact. No focal motor or sensory deficits appreciated bilaterally  PSYCHIATRIC: The patient is alert and oriented x 3.  SKIN: No obvious rash, lesion, or ulcer.   LABORATORY PANEL:   CBC  Recent Labs Lab 12/26/16 1001  WBC 12.7*  HGB 14.4  HCT 41.3  PLT 301   ------------------------------------------------------------------------------------------------------------------  Chemistries   Recent Labs Lab 12/26/16 1001  NA 139  K 3.7  CL 107  CO2 24  GLUCOSE 105*  BUN  17  CREATININE 1.35*  CALCIUM 9.5  AST 38  ALT 29  ALKPHOS 55  BILITOT 0.9   ------------------------------------------------------------------------------------------------------------------  Cardiac Enzymes No results for input(s): TROPONINI in the last 168 hours. ------------------------------------------------------------------------------------------------------------------  RADIOLOGY:  Dg Lumbar Spine 2-3 Views  Result Date: 12/26/2016 CLINICAL DATA:  Awoke this morning with RIGHT side low back pain, no known injury EXAM: LUMBAR SPINE - 2-3 VIEW COMPARISON:  CT abdomen and pelvis 12/26/2016 FINDINGS: Five non-rib-bearing lumbar vertebra. Question mild osseous demineralization. Disc space narrowing at L4-L5 and L5-S1 with endplate spur formation. Retrolisthesis at L5-S1 approximately 5 mm. Minimal anterolisthesis at L4-L5 question 3 mm. Vertebral body heights maintained without fracture or bone destruction. Facet degenerative changes lower lumbar spine. SI joints preserved. Excreted contrast material within renal collecting systems and bladder. IMPRESSION: Scattered degenerative disc and facet disease changes of the lumbar spine with mild retrolisthesis at L5-S1 and anterolisthesis at L4-L5. Electronically Signed   By: Ulyses SouthwardMark  Boles M.D.   On: 12/26/2016 15:15   Ct Angio Chest Pe W Or Wo Contrast  Result Date: 12/26/2016 CLINICAL DATA:  Pt states he began having right sided severe flank pain that began this AM. Pt started having nausea and dry heaves after pain started. Pt hunched over in pain upon.  Pt has had urine output since pain began. EXAM: CT ANGIOGRAPHY CHEST WITH CONTRAST TECHNIQUE: Multidetector CT imaging of the chest was performed using the standard protocol during bolus administration of intravenous contrast. Multiplanar CT image reconstructions and MIPs were obtained to evaluate the vascular anatomy. CONTRAST:  75 mL Isovue 370 COMPARISON:  None. FINDINGS: Cardiovascular:  Satisfactory opacification of the pulmonary arteries to the segmental level. No evidence of pulmonary embolism. Normal heart size. No pericardial effusion. Thoracic aorta is normal in caliber. No thoracic aortic dissection. Mediastinum/Nodes: No enlarged mediastinal, hilar, or axillary lymph nodes. Thyroid gland, trachea, and esophagus demonstrate no significant findings. Lungs/Pleura: Left lower lobe airspace disease concerning for atelectasis versus pneumonia. No pleural effusion or pneumothorax. Upper Abdomen: No acute abnormality. Musculoskeletal: No chest wall abnormality. No acute or significant osseous findings. Review of the MIP images confirms the above findings. IMPRESSION: 1. No pulmonary embolus. 2. No thoracic aortic aneurysm or dissection. 3. Left lower lobe airspace disease concerning for atelectasis versus pneumonia. Electronically Signed   By: Elige Ko   On: 12/26/2016 13:54   Ct Renal Stone Study  Result Date: 12/26/2016 CLINICAL DATA:  Severe right-sided flank pain today with nausea and dry heaves. Decreased urine output. History of kidney stones. EXAM: CT ABDOMEN AND PELVIS WITHOUT CONTRAST TECHNIQUE: Multidetector CT imaging of the abdomen and pelvis was performed following the standard protocol without IV contrast. COMPARISON:  Abdominal ultrasound same date. FINDINGS: Lower chest: Patchy dependent opacities at both lung bases, likely atelectasis. There is no consolidation, pleural or pericardial effusion. Hepatobiliary: The liver demonstrates mild heterogeneous steatosis. No focal abnormalities are demonstrated on noncontrast imaging. Small gallstones are noted. No evidence of gallbladder wall thickening, surrounding inflammation or biliary dilatation. Pancreas: Unremarkable. No pancreatic ductal dilatation or surrounding inflammatory changes. Spleen: Normal in size without focal abnormality. Adrenals/Urinary Tract: Mild fullness of both adrenal glands without focal nodule. Both  kidneys appear unremarkable as imaged in the noncontrast state. There is no evidence of renal or ureteral calculus. There is no perinephric soft tissue stranding. A 5 mm calcification projects over the inferior wall of the bladder on sagittal image number 97. This is not dependently located and may be within the bladder wall or external to the bladder. No focal bladder lesion identified. Stomach/Bowel: No evidence of bowel wall thickening or distention. There is questionable mild soft tissue stranding in the right lower quadrant surrounding the terminal ileum. The appendix appears normal. There are moderate diverticular changes throughout the descending and sigmoid colon without surrounding inflammation. Vascular/Lymphatic: There are no enlarged abdominal or pelvic lymph nodes. Mild aortoiliac atherosclerosis. Reproductive: Mild enlargement of the prostate gland. The seminal vesicles appear unremarkable. There are multiple pelvic phleboliths bilaterally. Other: No evidence of abdominal wall mass or hernia. No ascites. Musculoskeletal: No acute or significant osseous findings. Transitional lumbosacral anatomy with prominent degenerative disc disease above the transitional segment. There is a grade 1 anterolisthesis at L4-5 and retrolisthesis at L5-S1. Small peripherally sclerotic lesion in the left iliac bone has a nonaggressive appearance. IMPRESSION: 1. Questionable mild inflammatory changes in the right lower quadrant associated with the terminal ileum, potentially symptomatic. The appendix appears normal. 2. No evidence of hydronephrosis or ureteral calculus. Small calcification projecting over the inferior right aspect of the bladder could reflect a small adherent calculus or calcification in the bladder wall. No bladder wall mass or suspicious renal finding on noncontrast imaging. 3. Heterogeneous hepatic steatosis and cholelithiasis. 4. Colonic diverticulosis without evidence of acute inflammation. 5. Lower  lumbar  spondylosis. Electronically Signed   By: Carey Bullocks M.D.   On: 12/26/2016 13:59   US Abdomen Limited Ruq  Result Date: 12/26/2016 CLINICAL DATA:  Right upper quadrant pain EXAM: US ABDOMEN LIMITED - RIGHT UPPER QUADRANT COMPARISON:  None. FINDINGS: Gallbladder: Multiple gallstones are noted without gallbladder wall thickening or pericholecystic fluid Common bile duct: Diameter: 5.4 mm Liver: Mild increase in echogenicity consistent with fatty infiltration. IMPRESSION: Cholelithiasis without complicating factors. Fatty liver. Electronically Signed   By: Alcide Clever M.D.   On: 12/26/2016 12:07     IMPRESSION AND PLAN:   37 old male with past medical history of hypertension who presents to the hospital due to right-sided flank abdominal pain and lower back pain. Associated with some nausea.   1. Right flank/lower back pain-suspected to be musculoskeletal in nature. Patient underwent an extensive workup including CT scan of the chest and abdomen pelvis showing no acute pathology. Patient also had a right upper quadrant ultrasound which showed cholelithiasis but without any evidence of acute cholecystitis. Patient's LFTs are normal lipase is normal. -Patient did do some heavy lifting about a week or so ago which could be exacerbating his symptoms now. Patient has been given a few doses of IV Dilaudid with some improvement in his pain. His x-ray of his lumbar spine shows no evidence of herniated disc or acute fracture. I will presently discharge him on some Valium, Norco as needed for pain. -Patient's nausea was likely related to the pain but no acute intra-abdominal pathology.  2. Essential hypertension-patient resume his Norvasc, lisinopril, triamterene/HCTZ.   Patient is being discharged home from the emergency room with follow-up with his primary care physician and asked few weeks. Patient advised to return back to the ER if his symptoms get worse.   All the records are reviewed and  case discussed with Consulting provider. Management plans discussed with the patient, family and they are in agreement.  CODE STATUS: Full code  TOTAL TIME TAKING CARE OF THIS PATIENT: 45 minutes.    Houston Siren M.D on 12/26/2016 at 3:26 PM  Between 7am to 6pm - Pager - [phone]  After 6pm go to [email or web address] - password [email or web address] [facility]  [facility]  Office  [phone]  CC: Primary care Physician: Quinn Plowman, PA-C

## 2016-12-26 NOTE — ED Provider Notes (Addendum)
[facility] Emergency Department Provider Note   ____________________________________________   First MD Initiated Contact with Patient 12/26/16 1038     (approximate)  I have reviewed the triage vital signs and the nursing notes.   HISTORY  Chief Complaint Flank Pain   HPI Allen Shah is a 61 y.o. male who woke up this morning with severe right-sided flank pain and swelling radiating around to the front of his ribs. Patient has pain on palpation of the right CVA area on the right upper quadrant. He is not having any dysuria he is not having any fever and pain does not radiate down in towards his groin. He has severe pain it's worse with deep breathing   Past Medical History:  Diagnosis Date  . Hypertension   . Parotid mass     Patient Active Problem List   Diagnosis Date Noted  . RUQ abdominal pain   . Cardiac arrhythmia 11/11/2015  . Essential hypertension 07/15/2015    Past Surgical History:  Procedure Laterality Date  . PAROTID GLAND TUMOR EXCISION Left   . TONSILLECTOMY  2006    Prior to Admission medications   Medication Sig Start Date End Date Taking? Authorizing Provider  amLODipine (NORVASC) 10 MG tablet Take 1 tablet (10 mg total) by mouth daily. 05/04/16  Yes Virl Axe, MD  fluticasone (FLONASE) 50 MCG/ACT nasal spray Place 1 spray into both nostrils daily. 12/08/16  Yes [provider]  lisinopril (PRINIVIL,ZESTRIL) 40 MG tablet Take 0.5 tablets (20 mg total) by mouth daily. 05/04/16  Yes Virl Axe, MD  Olopatadine HCl 0.2 % SOLN Place 1 drop into both eyes daily. 12/09/16  Yes [provider]  triamterene-hydrochlorothiazide (MAXZIDE) 75-50 MG tablet Take 1 tablet by mouth daily. 05/04/16  Yes Virl Axe, MD  ibuprofen (ADVIL,MOTRIN) 800 MG tablet Take 1 tablet (800 mg total) by mouth as needed. Patient not taking: Reported on 12/26/2016 11/11/15   Virl Axe, MD  oxyCODONE-acetaminophen (ROXICET)  5-325 MG tablet Take 1 tablet by mouth every 6 (six) hours as needed for severe pain. Patient not taking: Reported on 12/26/2016 02/12/16   Sharman Cheek, MD    Allergies Patient has no known allergies.  Family History  Problem Relation Age of Onset  . Cancer Mother   . Diabetes type II Father   . Kidney disease Father   . Hypertension Father     Social History Social History  Substance Use Topics  . Smoking status: Former Smoker    Quit date: 09/17/2005  . Smokeless tobacco: Never Used  . Alcohol use No    Review of Systems  Constitutional: No fever/chills Eyes: No visual changes. ENT: No sore throat. Cardiovascular: Denies chest pain. Respiratory: Denies shortness of breath. Gastrointestinal: See history of present illness Genitourinary: Negative for dysuria. Musculoskeletal: Negative for back pain. Skin: Negative for rash. Neurological: Negative for headaches, focal weakness or numbness.   ____________________________________________   PHYSICAL EXAM:  VITAL SIGNS: ED Triage Vitals  Enc Vitals Group     BP 12/26/16 0958 119/75     Pulse Rate 12/26/16 0958 77     Resp 12/26/16 0958 (!) 24     Temp 12/26/16 0958 99 F (37.2 C)     Temp Source 12/26/16 0958 Oral     SpO2 12/26/16 0958 98 %     Weight 12/26/16 0959 227 lb (103 kg)     Height 12/26/16 0959 6' (1.829 m)     Head  Circumference --      Peak Flow --      Pain Score 12/26/16 1002 10     Pain Loc --      Pain Edu? --      Excl. in GC? --     Constitutional: Alert and oriented.  in  acute distress. Eyes: Conjunctivae are normal.  Head: Atraumatic. Nose: No congestion/rhinnorhea. Mouth/Throat: Mucous membranes are moist.  Oropharynx non-erythematous. Neck: No stridor.  Cardiovascular: Normal rate, regular rhythm. Grossly normal heart sounds.  Good peripheral circulation. Respiratory: Normal respiratory effort.  No retractions. Lungs CTAB. Gastrointestinal: Soft tender to palpation right  upper quadrant worse with deep breathing. No distention. No abdominal bruits. Right CVA tenderness. Musculoskeletal: No lower extremity tenderness nor edema.  No joint effusions. Neurologic:  Normal speech and language. No gross focal neurologic deficits are appreciated. No gait instability. Skin:  Skin is warm, dry and intact. No rash noted. .  ____________________________________________   LABS (all labs ordered are listed, but only abnormal results are displayed)  Labs Reviewed  COMPREHENSIVE METABOLIC PANEL - Abnormal; Notable for the following:       Result Value   Glucose, Bld 105 (*)    Creatinine, Ser 1.35 (*)    Total Protein 8.4 (*)    GFR calc non Af Amer 56 (*)    All other components within normal limits  CBC - Abnormal; Notable for the following:    WBC 12.7 (*)    All other components within normal limits  DIFFERENTIAL - Abnormal; Notable for the following:    Lymphs Abs 5.4 (*)    Monocytes Absolute 1.3 (*)    All other components within normal limits  LIPASE, BLOOD  URINALYSIS, COMPLETE (UACMP) WITH MICROSCOPIC   ____________________________________________  EKG   ____________________________________________  RADIOLOGY udy Result   CLINICAL DATA:  Right upper quadrant pain  EXAM: US ABDOMEN LIMITED - RIGHT UPPER QUADRANT  COMPARISON:  None.  FINDINGS: Gallbladder:  Multiple gallstones are noted without gallbladder wall thickening or pericholecystic fluid  Common bile duct:  Diameter: 5.4 mm  Liver:  Mild increase in echogenicity consistent with fatty infiltration.  IMPRESSION: Cholelithiasis without complicating factors.  Fatty liver.   Electronically Signed   By: Alcide Clever M.D.   On: 12/26/2016 12:07    IMPRESSION: 1. No pulmonary embolus. 2. No thoracic aortic aneurysm or dissection. 3. Left lower lobe airspace disease concerning for atelectasis versus pneumonia.   Electronically Signed   By: Elige Ko   On: 12/26/2016 13:54 IMPRESSION: 1. Questionable mild inflammatory changes in the right lower quadrant associated with the terminal ileum, potentially symptomatic. The appendix appears normal. 2. No evidence of hydronephrosis or ureteral calculus. Small calcification projecting over the inferior right aspect of the bladder could reflect a small adherent calculus or calcification in the bladder wall. No bladder wall mass or suspicious renal finding on noncontrast imaging. 3. Heterogeneous hepatic steatosis and cholelithiasis. 4. Colonic diverticulosis without evidence of acute inflammation. 5. Lower lumbar spondylosis.   Electronically Signed   By: Carey Bullocks M.D.   On: 12/26/2016 13:59 ____________________________________________   PROCEDURES  Procedure(s) performed:   Procedures  Critical Care performed:   ____________________________________________   INITIAL IMPRESSION / ASSESSMENT AND PLAN / ED COURSE  Pertinent labs & imaging results that were available during my care of the patient were reviewed by me and considered in my medical decision making (see chart for details).   Patient still in considerable pain after 3  doses of IV Dilaudid uncertain of the etiology we'll attempt to admit him. Dr.Sainani will see him     ____________________________________________   FINAL CLINICAL IMPRESSION(S) / ED DIAGNOSES  Final diagnoses:  Pain  Right flank pain  Intractable pain      NEW MEDICATIONS STARTED DURING THIS VISIT:  New Prescriptions   No medications on file     Note:  This document was prepared using Dragon voice recognition software and may include unintentional dictation errors.    Arnaldo NatalMalinda, Paul F, MD 12/26/16 1421    Arnaldo NatalMalinda, Paul F, MD 12/26/16 1537

## 2016-12-26 NOTE — ED Notes (Signed)
Pt provided gingerale and graham crackers per MD request; advised to let RN know if he has any trouble keeping food down.

## 2016-12-26 NOTE — ED Triage Notes (Signed)
Pt reports right flank pain that started this AM, reports nausea, denies any urinary complaints. Pt reports taking muscle relaxer with no relief.

## 2016-12-26 NOTE — ED Notes (Signed)
Patient transported to X-ray 

## 2016-12-26 NOTE — ED Notes (Signed)
Surgeon at bedside.  

## 2016-12-26 NOTE — ED Notes (Signed)
Pt states he began having right sided severe flank pain that began this AM. Pt started having nausea and dry heaves after pain started. Pt hunched over in pain upon. Pt has had urine output since pain began. No hx of kidney stones.

## 2018-11-18 IMAGING — CT CT RENAL STONE PROTOCOL
2 of 4 series · 15 of 46 positions shown, 17 images · non-contrast
Comparison: Abdominal ultrasound same date.

CLINICAL DATA: Severe right-sided flank pain today with nausea and
dry heaves. Decreased urine output. History of kidney stones.

EXAM:
CT ABDOMEN AND PELVIS WITHOUT CONTRAST
TECHNIQUE: Multidetector CT imaging of the abdomen and pelvis was performed
following the standard protocol without IV contrast.

[Series 2: stone full standard · axial · 0.80mm/px · z∈[-713,-248]mm · 12 of 107 slices shown, 14 images]
[im 9/107  soft-tissue]
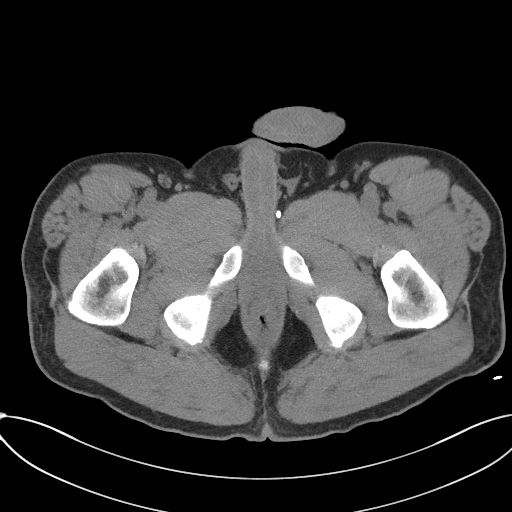
[im 9/107  bone]
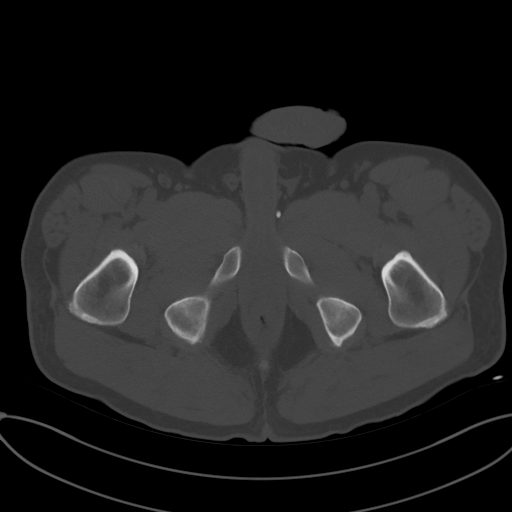
[im 17/107  soft-tissue]
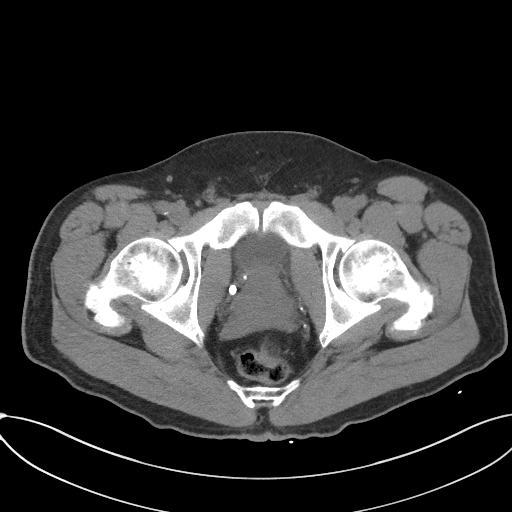
[im 26/107  soft-tissue]
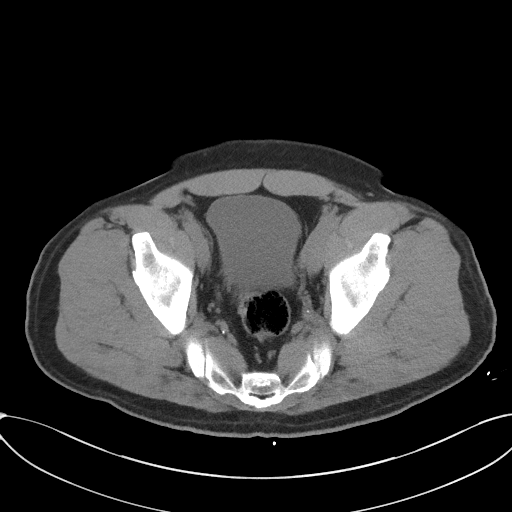
[im 34/107  soft-tissue]
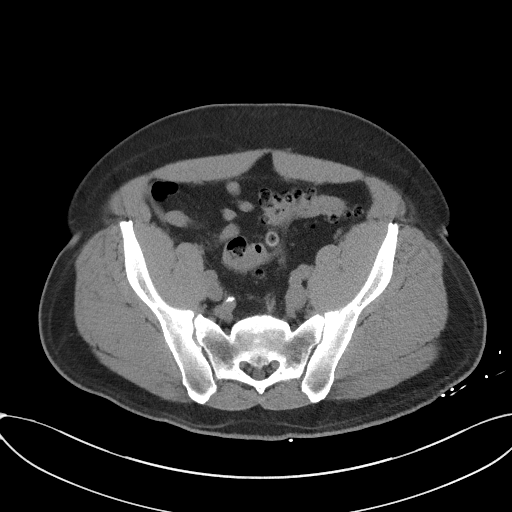
[im 43/107  soft-tissue]
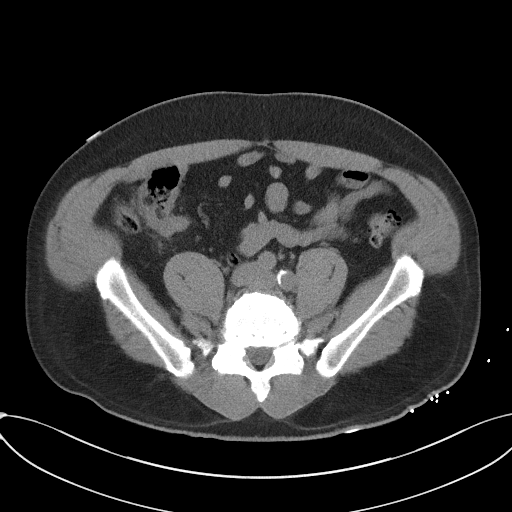
[im 51/107  soft-tissue]
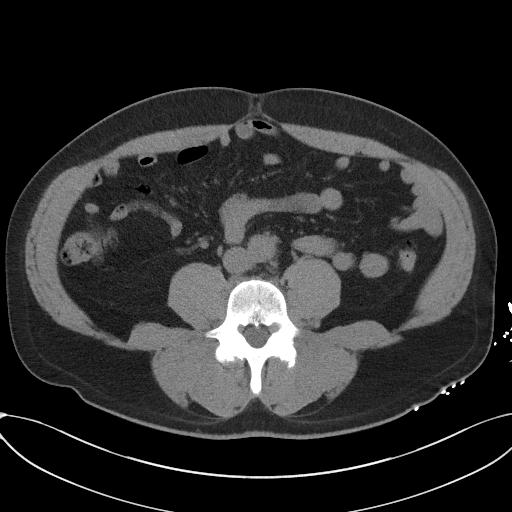
[im 60/107  soft-tissue]
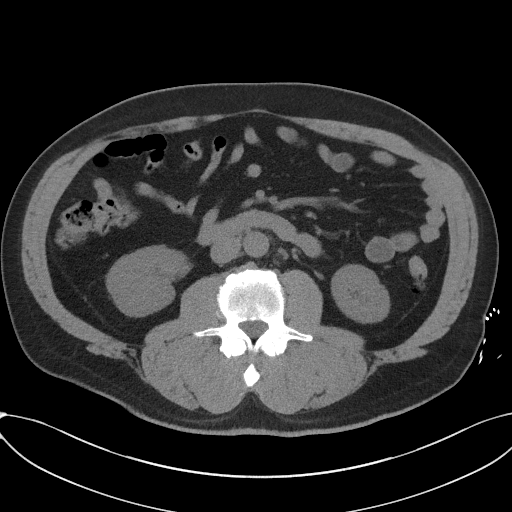
[im 68/107  soft-tissue]
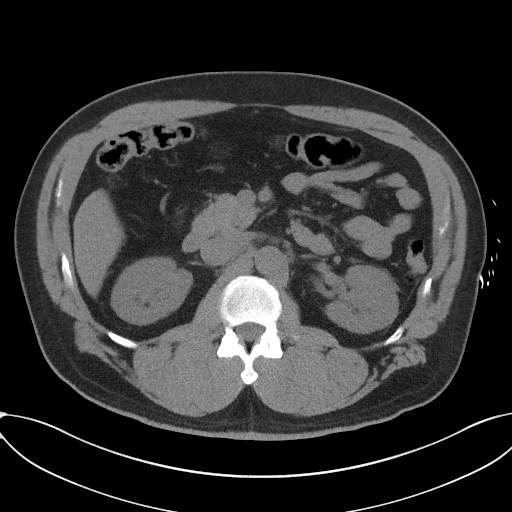
[im 77/107  soft-tissue]
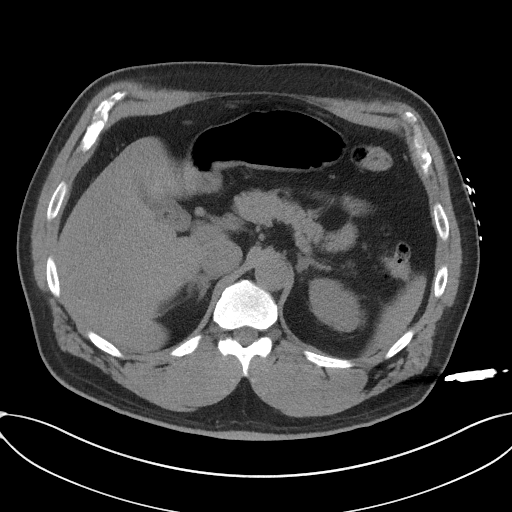
[im 77/107  bone]
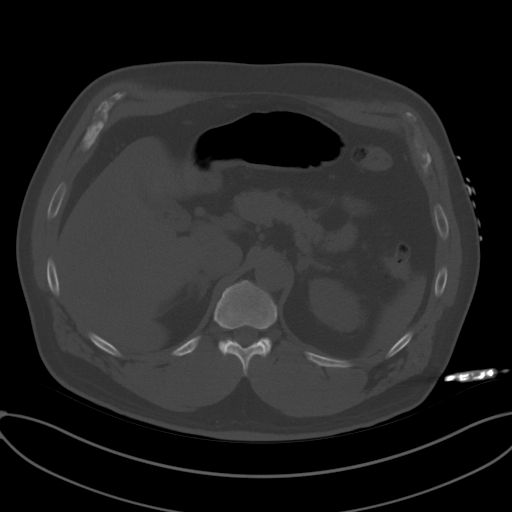
[im 85/107  soft-tissue]
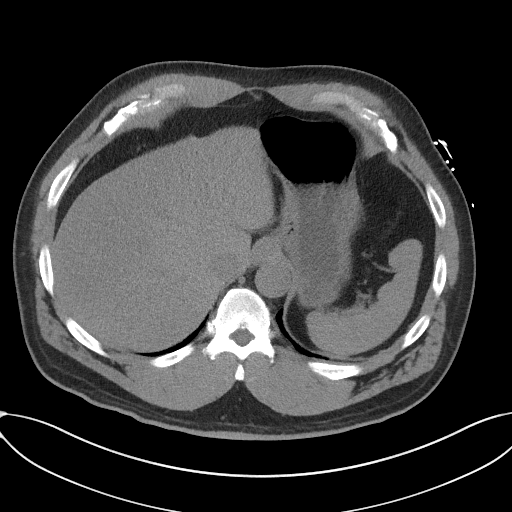
[im 94/107  soft-tissue]
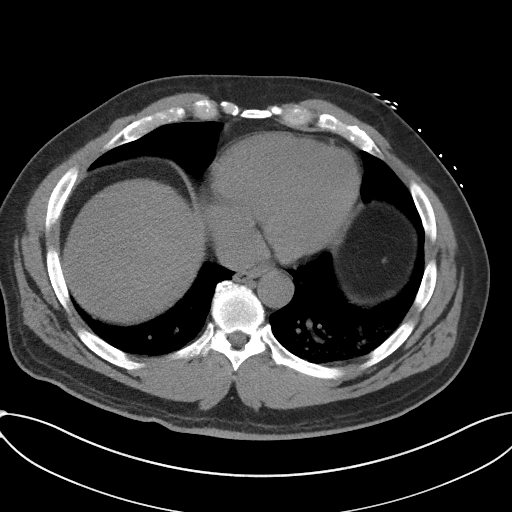
[im 102/107  soft-tissue]
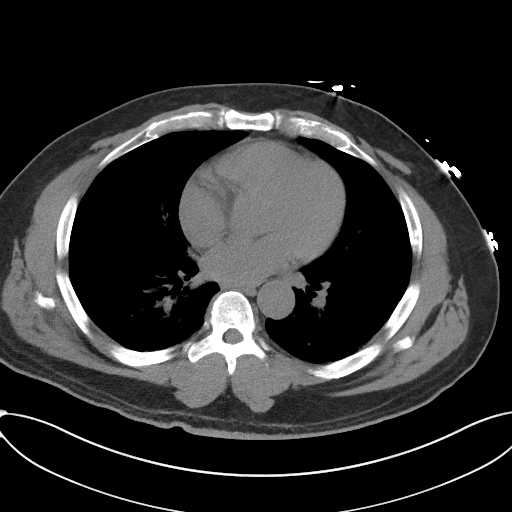

[Series 5: coronal · coronal · 0.89mm/px · 3 of 146 slices shown]
[im 49/146  soft-tissue]
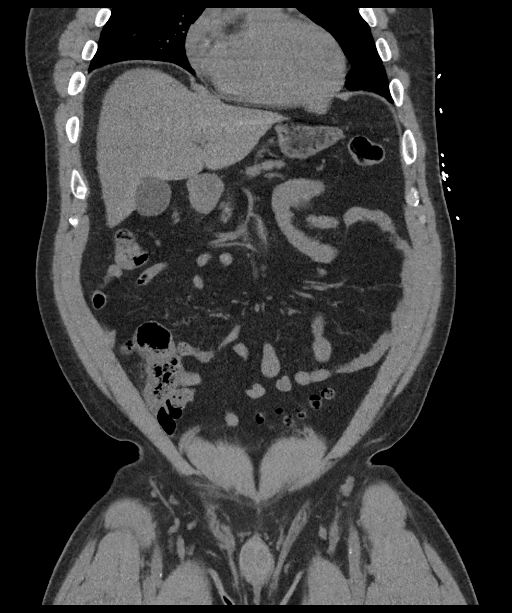
[im 65/146  soft-tissue]
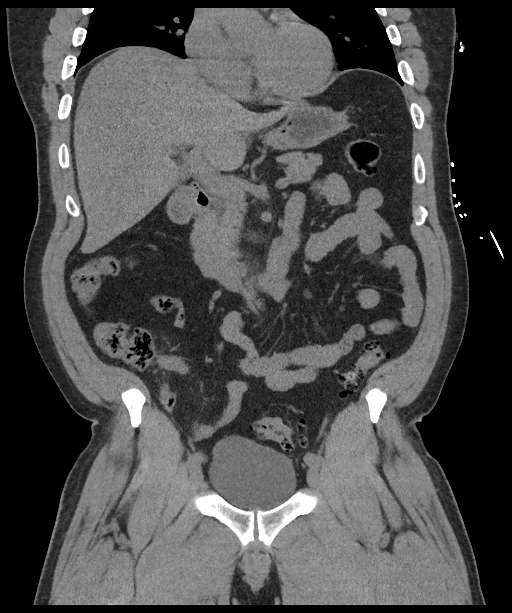
[im 81/146  soft-tissue]
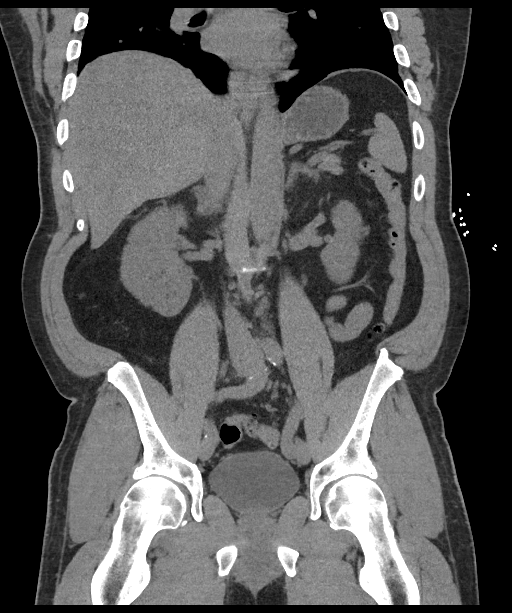

[15 of 46 positions shown; findings below may reference images not displayed]

FINDINGS: Lower chest: Patchy dependent opacities at both lung bases, likely
atelectasis. There is no consolidation, pleural or pericardial
effusion.

Hepatobiliary: The liver demonstrates mild heterogeneous steatosis.
No focal abnormalities are demonstrated on noncontrast imaging.
Small gallstones are noted. No evidence of gallbladder wall
thickening, surrounding inflammation or biliary dilatation.

Pancreas: Unremarkable. No pancreatic ductal dilatation or
surrounding inflammatory changes.

Spleen: Normal in size without focal abnormality.

Adrenals/Urinary Tract: Mild fullness of both adrenal glands without
focal nodule. Both kidneys appear unremarkable as imaged in the
noncontrast state. There is no evidence of renal or ureteral
calculus. There is no perinephric soft tissue stranding. A 5 mm
calcification projects over the inferior wall of the bladder on
sagittal image number 97. This is not dependently located and may be
within the bladder wall or external to the bladder. No focal bladder
lesion identified.

Stomach/Bowel: No evidence of bowel wall thickening or distention.
There is questionable mild soft tissue stranding in the right lower
quadrant surrounding the terminal ileum. The appendix appears
normal. There are moderate diverticular changes throughout the
descending and sigmoid colon without surrounding inflammation.

Vascular/Lymphatic: There are no enlarged abdominal or pelvic lymph
nodes. Mild aortoiliac atherosclerosis.

Reproductive: Mild enlargement of the prostate gland. The seminal
vesicles appear unremarkable. There are multiple pelvic phleboliths
bilaterally.

Other: No evidence of abdominal wall mass or hernia. No ascites.

Musculoskeletal: No acute or significant osseous findings.
Transitional lumbosacral anatomy with prominent degenerative disc
disease above the transitional segment. There is a grade 1
anterolisthesis at L4-5 and retrolisthesis at L5-S1. Small
peripherally sclerotic lesion in the left iliac bone has a
nonaggressive appearance.
IMPRESSION: 1. Questionable mild inflammatory changes in the right lower
quadrant associated with the terminal ileum, potentially
symptomatic. The appendix appears normal.
2. No evidence of hydronephrosis or ureteral calculus. Small
calcification projecting over the inferior right aspect of the
bladder could reflect a small adherent calculus or calcification in
the bladder wall. No bladder wall mass or suspicious renal finding
on noncontrast imaging.
3. Heterogeneous hepatic steatosis and cholelithiasis.
4. Colonic diverticulosis without evidence of acute inflammation.
5. Lower lumbar spondylosis.
